# Patient Record
Sex: Male | Born: 1960 | Race: White | Hispanic: No | Marital: Married | Smoking: Never smoker
Health system: Southern US, Community
[De-identification: ages and names within clinical notes are randomized; demographics above are authoritative.]

## PROBLEM LIST (undated history)

## (undated) HISTORY — PX: HERNIA REPAIR: SHX51

## (undated) HISTORY — PX: FINGER AMPUTATION: SHX636

---

## 2009-02-04 ENCOUNTER — Emergency Department: Payer: Self-pay | Admitting: Unknown Physician Specialty

## 2009-04-12 ENCOUNTER — Emergency Department: Payer: Self-pay | Admitting: Internal Medicine

## 2012-05-08 ENCOUNTER — Emergency Department: Payer: Self-pay | Admitting: Emergency Medicine

## 2014-09-11 ENCOUNTER — Emergency Department: Payer: Self-pay | Admitting: Emergency Medicine

## 2016-08-03 ENCOUNTER — Encounter: Payer: Self-pay | Admitting: Emergency Medicine

## 2016-08-03 ENCOUNTER — Emergency Department: Payer: No Typology Code available for payment source

## 2016-08-03 ENCOUNTER — Emergency Department
Admission: EM | Admit: 2016-08-03 | Discharge: 2016-08-03 | Disposition: A | Discharge status: Home / Self Care | Payer: No Typology Code available for payment source | Attending: Emergency Medicine | Admitting: Emergency Medicine

## 2016-08-03 DIAGNOSIS — Y92411 Interstate highway as the place of occurrence of the external cause: Secondary | ICD-10-CM | POA: Insufficient documentation

## 2016-08-03 DIAGNOSIS — R51 Headache: Secondary | ICD-10-CM | POA: Diagnosis not present

## 2016-08-03 DIAGNOSIS — Y9389 Activity, other specified: Secondary | ICD-10-CM | POA: Diagnosis not present

## 2016-08-03 DIAGNOSIS — S60221A Contusion of right hand, initial encounter: Secondary | ICD-10-CM | POA: Diagnosis not present

## 2016-08-03 DIAGNOSIS — S3991XA Unspecified injury of abdomen, initial encounter: Secondary | ICD-10-CM | POA: Diagnosis not present

## 2016-08-03 DIAGNOSIS — R109 Unspecified abdominal pain: Secondary | ICD-10-CM

## 2016-08-03 DIAGNOSIS — R0789 Other chest pain: Secondary | ICD-10-CM | POA: Diagnosis not present

## 2016-08-03 DIAGNOSIS — Y999 Unspecified external cause status: Secondary | ICD-10-CM | POA: Insufficient documentation

## 2016-08-03 DIAGNOSIS — S161XXA Strain of muscle, fascia and tendon at neck level, initial encounter: Secondary | ICD-10-CM | POA: Diagnosis not present

## 2016-08-03 DIAGNOSIS — S199XXA Unspecified injury of neck, initial encounter: Secondary | ICD-10-CM | POA: Diagnosis present

## 2016-08-03 DIAGNOSIS — R079 Chest pain, unspecified: Secondary | ICD-10-CM

## 2016-08-03 LAB — CBC WITH DIFFERENTIAL/PLATELET
BASOS PCT: 0 %
Basophils Absolute: 0.1 10*3/uL (ref 0–0.1)
Eosinophils Absolute: 0.1 10*3/uL (ref 0–0.7)
Eosinophils Relative: 0 %
HEMATOCRIT: 45.9 % (ref 40.0–52.0)
Hemoglobin: 16.3 g/dL (ref 13.0–18.0)
LYMPHS PCT: 7 %
Lymphs Abs: 1 10*3/uL (ref 1.0–3.6)
MCH: 33.4 pg (ref 26.0–34.0)
MCHC: 35.5 g/dL (ref 32.0–36.0)
MCV: 94.1 fL (ref 80.0–100.0)
MONO ABS: 0.8 10*3/uL (ref 0.2–1.0)
MONOS PCT: 6 %
NEUTROS ABS: 12.9 10*3/uL — AB (ref 1.4–6.5)
Neutrophils Relative %: 87 %
Platelets: 196 10*3/uL (ref 150–440)
RBC: 4.88 MIL/uL (ref 4.40–5.90)
RDW: 12.8 % (ref 11.5–14.5)
WBC: 14.9 10*3/uL — ABNORMAL HIGH (ref 3.8–10.6)

## 2016-08-03 LAB — COMPREHENSIVE METABOLIC PANEL
ALBUMIN: 4.5 g/dL (ref 3.5–5.0)
ALK PHOS: 53 U/L (ref 38–126)
ALT: 32 U/L (ref 17–63)
AST: 50 U/L — AB (ref 15–41)
Anion gap: 11 (ref 5–15)
BILIRUBIN TOTAL: 0.7 mg/dL (ref 0.3–1.2)
BUN: 15 mg/dL (ref 6–20)
CO2: 22 mmol/L (ref 22–32)
Calcium: 8.8 mg/dL — ABNORMAL LOW (ref 8.9–10.3)
Chloride: 107 mmol/L (ref 101–111)
Creatinine, Ser: 1.06 mg/dL (ref 0.61–1.24)
GFR calc Af Amer: >60 mL/min (ref >60)
GFR calc non Af Amer: >60 mL/min (ref >60)
GLUCOSE: 102 mg/dL — AB (ref 65–99)
POTASSIUM: 4.1 mmol/L (ref 3.5–5.1)
Sodium: 140 mmol/L (ref 135–145)
TOTAL PROTEIN: 7.4 g/dL (ref 6.5–8.1)

## 2016-08-03 LAB — LIPASE, BLOOD: Lipase: 53 U/L — ABNORMAL HIGH (ref 11–51)

## 2016-08-03 MED ORDER — SODIUM CHLORIDE 0.9 % IV BOLUS (SEPSIS)
1000.0000 mL | Freq: Once | INTRAVENOUS | Status: AC
  Administered 2016-08-03: 1000 mL via INTRAVENOUS

## 2016-08-03 MED ORDER — OXYCODONE-ACETAMINOPHEN 5-325 MG PO TABS: 1.0000 | ORAL_TABLET | Freq: Four times a day (QID) | ORAL | 0 refills | Status: AC | PRN

## 2016-08-03 MED ORDER — HYDROMORPHONE HCL 1 MG/ML IJ SOLN
1.0000 mg | Freq: Once | INTRAMUSCULAR | Status: AC
  Administered 2016-08-03: 1 mg via INTRAVENOUS

## 2016-08-03 MED ORDER — NAPROXEN 500 MG PO TABS: 500.0000 mg | ORAL_TABLET | Freq: Two times a day (BID) | ORAL | 0 refills | Status: DC

## 2016-08-03 MED ORDER — HYDROMORPHONE HCL 1 MG/ML IJ SOLN
INTRAMUSCULAR | Status: AC
  Administered 2016-08-03: 1 mg via INTRAVENOUS
  Filled 2016-08-03: qty 1

## 2016-08-03 MED ORDER — IOPAMIDOL (ISOVUE-300) INJECTION 61%
100.0000 mL | Freq: Once | INTRAVENOUS | Status: AC | PRN
  Administered 2016-08-03: 100 mL via INTRAVENOUS
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
## 2016-08-03 NOTE — ED Notes (Signed)
Pt. Back from CT.
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
## 2016-08-03 NOTE — ED Notes (Signed)
Pt. Going home with wife. 

## 2020-09-08 ENCOUNTER — Emergency Department: Payer: No Typology Code available for payment source

## 2020-09-08 ENCOUNTER — Emergency Department
Admission: EM | Admit: 2020-09-08 | Discharge: 2020-09-08 | Disposition: A | Discharge status: Home / Self Care | Payer: No Typology Code available for payment source | Attending: Emergency Medicine | Admitting: Emergency Medicine

## 2020-09-08 ENCOUNTER — Other Ambulatory Visit: Payer: Self-pay

## 2020-09-08 DIAGNOSIS — M25562 Pain in left knee: Secondary | ICD-10-CM

## 2020-09-08 DIAGNOSIS — Y92009 Unspecified place in unspecified non-institutional (private) residence as the place of occurrence of the external cause: Secondary | ICD-10-CM | POA: Diagnosis not present

## 2020-09-08 DIAGNOSIS — W01198A Fall on same level from slipping, tripping and stumbling with subsequent striking against other object, initial encounter: Secondary | ICD-10-CM | POA: Insufficient documentation

## 2020-09-08 MED ORDER — NAPROXEN 500 MG PO TABS: 500.0000 mg | ORAL_TABLET | Freq: Two times a day (BID) | ORAL | 0 refills | Status: AC

## 2020-09-08 NOTE — ED Notes (Signed)
WC Profile for [employer] only states UDS obtain upon request. Pt states he does not have a number to his direct supervisor and office closed at 12:00pm today. UDS ineligibility form filled out on this pt./

## 2020-09-08 NOTE — ED Triage Notes (Signed)
Pt present POV from home. Pt states he was at work last night, he slipped in the break room and hit his L knee on the floor. Denies LOC. Pt is A&Ox4 and NAD. Pt c/o L knee pain. Pt ambulatory to triage.

## 2020-09-08 NOTE — ED Provider Notes (Signed)
[facility] Emergency Department Provider Note   ____________________________________________   First MD Initiated Contact with Patient 09/08/20 1635     (approximate)  I have reviewed the triage vital signs and the nursing notes.   HISTORY  Chief Complaint Knee Pain    HPI Roberto Peterson is a 59 y.o. male patient complain of left knee pain secondary to a slip and fall which occurred last night at work.  Patient said he hit his knee on the floor.  Patient is able to bear weight and there is no loss of sensation.  Patient rates pain as a 5/10.  Patient described the pain is "sore".  No palliative measure for complaint.         No past medical history on file.  There are no problems to display for this patient.   Past Surgical History:  Procedure Laterality Date  . FINGER AMPUTATION    . HERNIA REPAIR      Prior to Admission medications   Medication Sig Start Date End Date Taking? Authorizing Provider  naproxen (NAPROSYN) 500 MG tablet Take 1 tablet (500 mg total) by mouth 2 (two) times daily with a meal. 09/08/20   Joni Reining, PA-C  oxyCODONE-acetaminophen (ROXICET) 5-325 MG tablet Take 1 tablet by mouth every 6 (six) hours as needed for severe pain. 08/03/16   Sharman Cheek, MD    Allergies Patient has no known allergies.  No family history on file.  Social History Social History   Tobacco Use  . Smoking status: Never Smoker  . Smokeless tobacco: Never Used  Substance Use Topics  . Alcohol use: Not on file  . Drug use: Not on file    Review of Systems Constitutional: No fever/chills Eyes: No visual changes. ENT: No sore throat. Cardiovascular: Denies chest pain. Respiratory: Denies shortness of breath. Gastrointestinal: No abdominal pain.  No nausea, no vomiting.  No diarrhea.  No constipation. Genitourinary: Negative for dysuria. Musculoskeletal: Left knee pain. Skin: Negative for rash. Neurological: Negative for  headaches, focal weakness or numbness.   ____________________________________________   PHYSICAL EXAM:  VITAL SIGNS: ED Triage Vitals  Enc Vitals Group     BP 09/08/20 1638 (!) 141/81     Pulse Rate 09/08/20 1638 (!) 46     Resp 09/08/20 1638 20     Temp 09/08/20 1645 98.3 F (36.8 C)     Temp Source 09/08/20 1645 Oral     SpO2 09/08/20 1638 99 %     Weight 09/08/20 1638 180 lb (81.6 kg)     Height 09/08/20 1638 5\' 10"  (1.778 m)     Head Circumference --      Peak Flow --      Pain Score 09/08/20 1638 5     Pain Loc --      Pain Edu? --      Excl. in GC? --     Constitutional: Alert and oriented. Well appearing and in no acute distress. Cardiovascular: Normal rate, regular rhythm. Grossly normal heart sounds.  Good peripheral circulation. Respiratory: Normal respiratory effort.  No retractions. Lungs CTAB. Musculoskeletal: No obvious deformity to the left knee.  Patient is moderate guarding with palpation of the tibia plateau. Neurologic:  Normal speech and language. No gross focal neurologic deficits are appreciated. No gait instability. Skin:  Skin is warm, dry and intact. No rash noted.  No abrasion or ecchymosis. Psychiatric: Mood and affect are normal. Speech and behavior are normal.  ____________________________________________  LABS (all labs ordered are listed, but only abnormal results are displayed)  Labs Reviewed - No data to display ____________________________________________  EKG   ____________________________________________  RADIOLOGY I, Joni Reining, personally viewed and evaluated these images (plain radiographs) as part of my medical decision making, as well as reviewing the written report by the radiologist.  ED MD interpretation: No acute findings on x-ray of the left knee. Official radiology report(s): DG Knee Complete 4 Views Left  Result Date: 09/08/2020 CLINICAL DATA:  Pain following fall EXAM: LEFT KNEE - COMPLETE 4+ VIEW  COMPARISON:  None. FINDINGS: Frontal, lateral, and bilateral oblique views were obtained. There is no appreciable fracture or dislocation. Joint spaces appear unremarkable. No erosive change. There are foci of atherosclerotic vascular calcification at several sites. IMPRESSION: No fracture, dislocation, or joint effusion. No evident arthropathy. Foci of arterial vascular calcification noted. Electronically Signed   By: Bretta Bang III M.D.   On: 09/08/2020 17:30    ____________________________________________   PROCEDURES  Procedure(s) performed (including Critical Care):  Procedures   ____________________________________________   INITIAL IMPRESSION / ASSESSMENT AND PLAN / ED COURSE  As part of my medical decision making, I reviewed the following data within the electronic MEDICAL RECORD NUMBER     Patient presented with left knee pain secondary to a slip and fall which occurred last night at work.  Discussed no acute findings on x-ray of the left knee.  Patient complaint and physical exam consistent with knee contusion secondary to fall.  Patient placed in a knee support and given discharge care instruction.  Patient can have a prescription for naproxen take as needed patient advised if no improvement or worsening complaint to follow-up with orthopedic listed in the discharge care instructions.         ____________________________________________   FINAL CLINICAL IMPRESSION(S) / ED DIAGNOSES  Final diagnoses:  Acute pain of left knee     ED Discharge Orders         Ordered    naproxen (NAPROSYN) 500 MG tablet  2 times daily with meals        09/08/20 1744          *Please note:  Roberto Peterson was evaluated in Emergency Department on 09/08/2020 for the symptoms described in the history of present illness. He was evaluated in the context of the global COVID-19 pandemic, which necessitated consideration that the patient might be at risk for infection with the SARS-CoV-2  virus that causes COVID-19. Institutional protocols and algorithms that pertain to the evaluation of patients at risk for COVID-19 are in a state of rapid change based on information released by regulatory bodies including the CDC and federal and state organizations. These policies and algorithms were followed during the patient's care in the ED.  Some ED evaluations and interventions may be delayed as a result of limited staffing during and the pandemic.*   Note:  This document was prepared using Dragon voice recognition software and may include unintentional dictation errors.    Joni Reining, PA-C 09/08/20 1749    Dionne Bucy, MD 09/08/20 2037

## 2020-09-08 NOTE — Discharge Instructions (Signed)
Wear knee support and follow discharge care instruction.  Take medication as needed.

## 2021-08-06 IMAGING — CR DG KNEE COMPLETE 4+V*L*
4 series · 4 of 4 positions shown · non-contrast
Comparison: None.

CLINICAL DATA: Pain following fall

EXAM:
LEFT KNEE - COMPLETE 4+ VIEW

[knee ap]
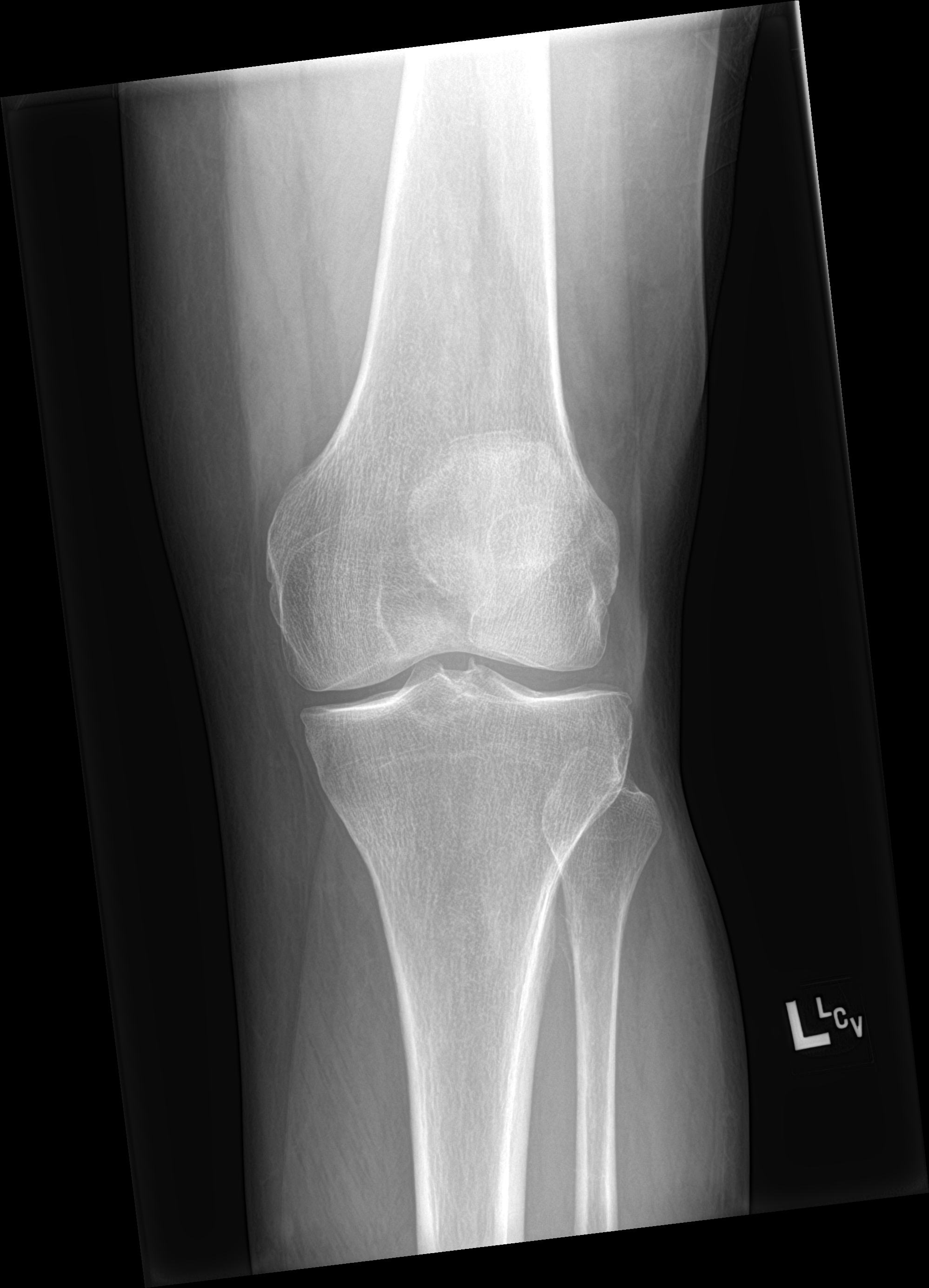

[knee obl (1 of 2)]
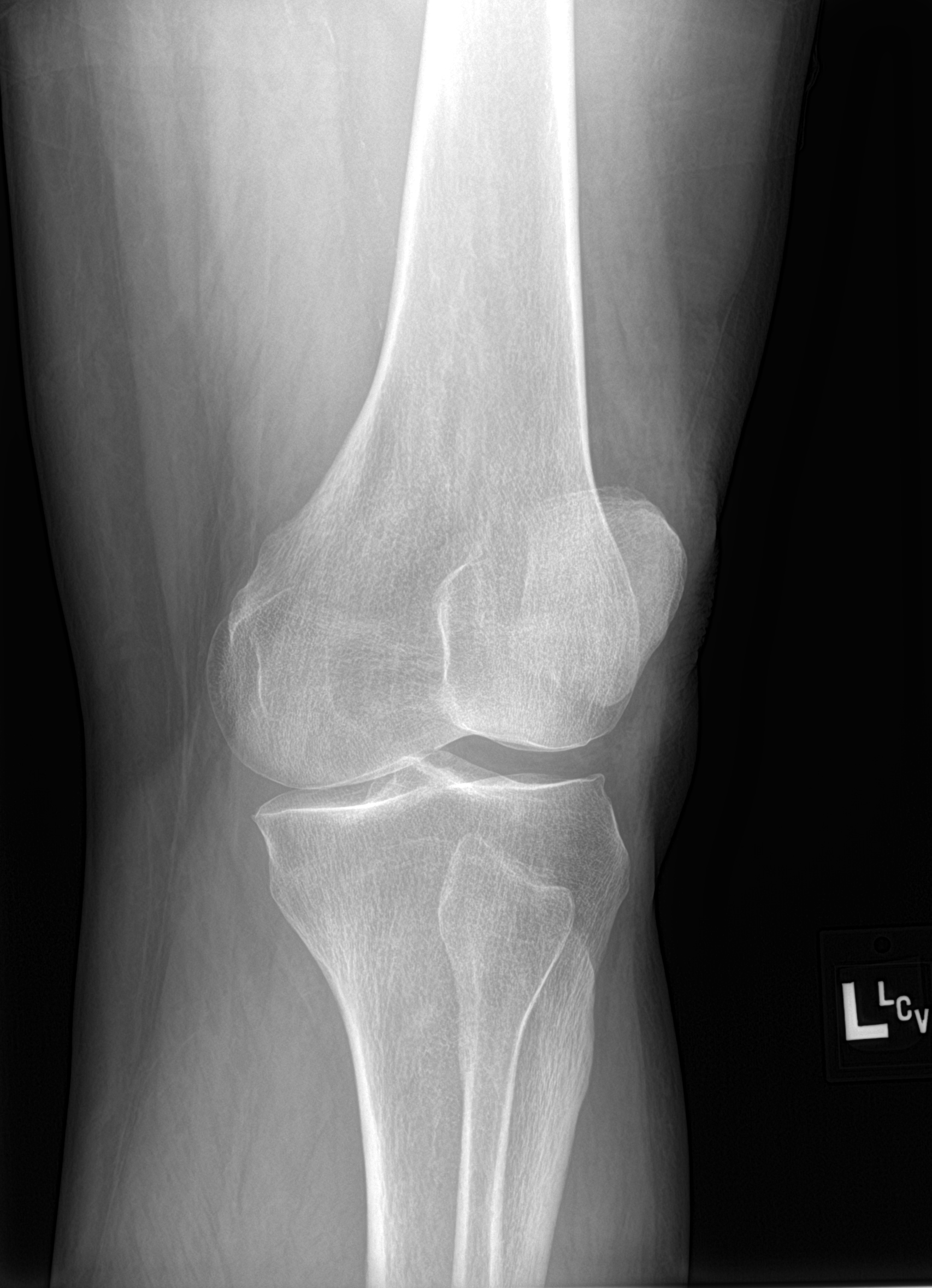

[knee obl (2 of 2)]
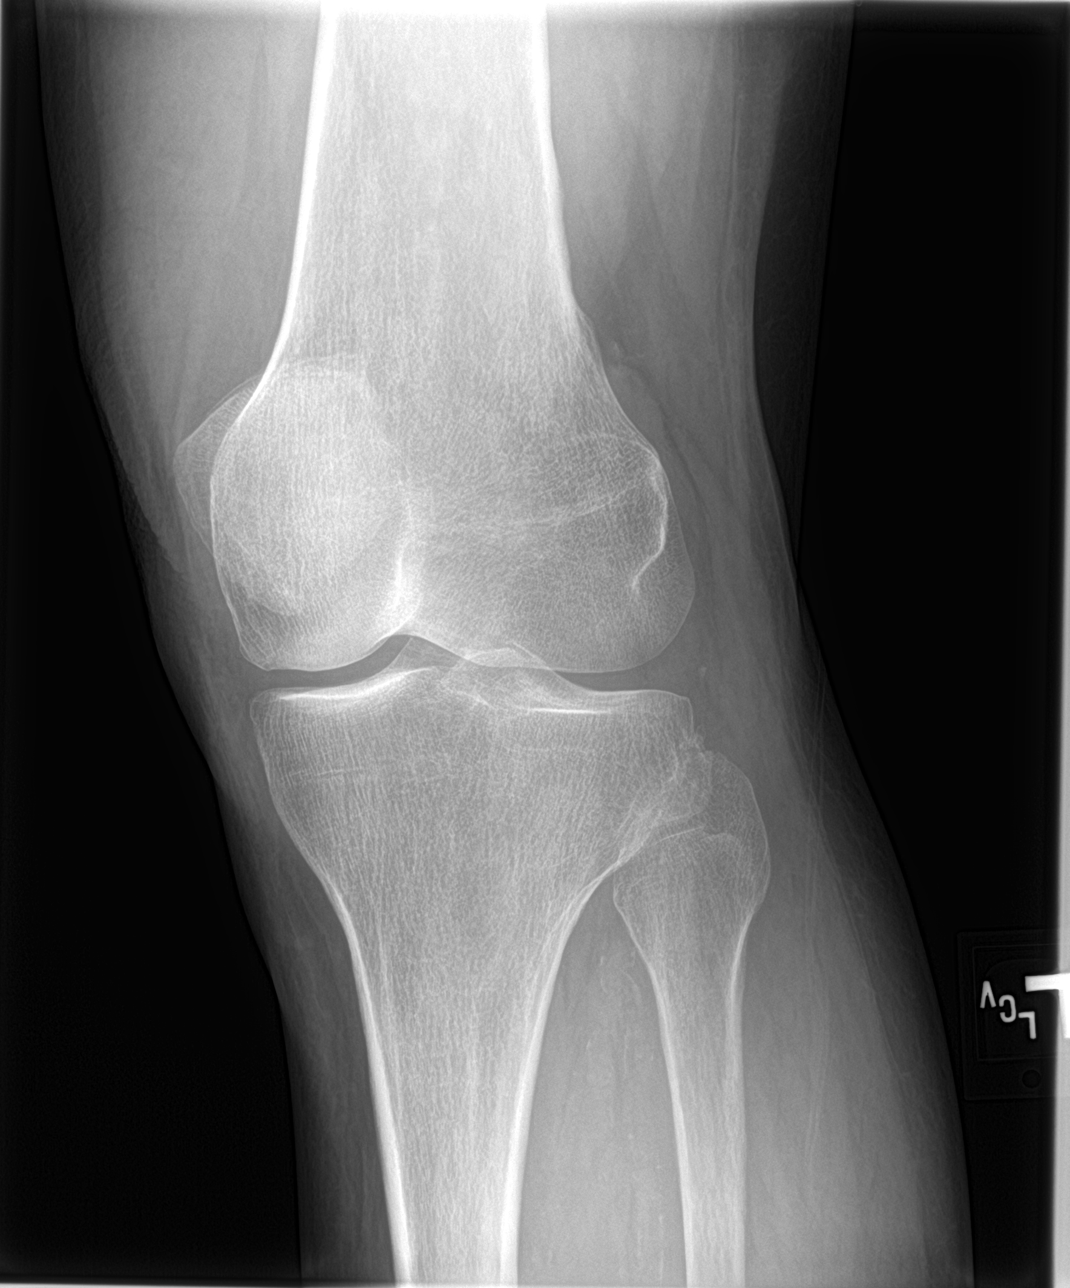

[knee lat]
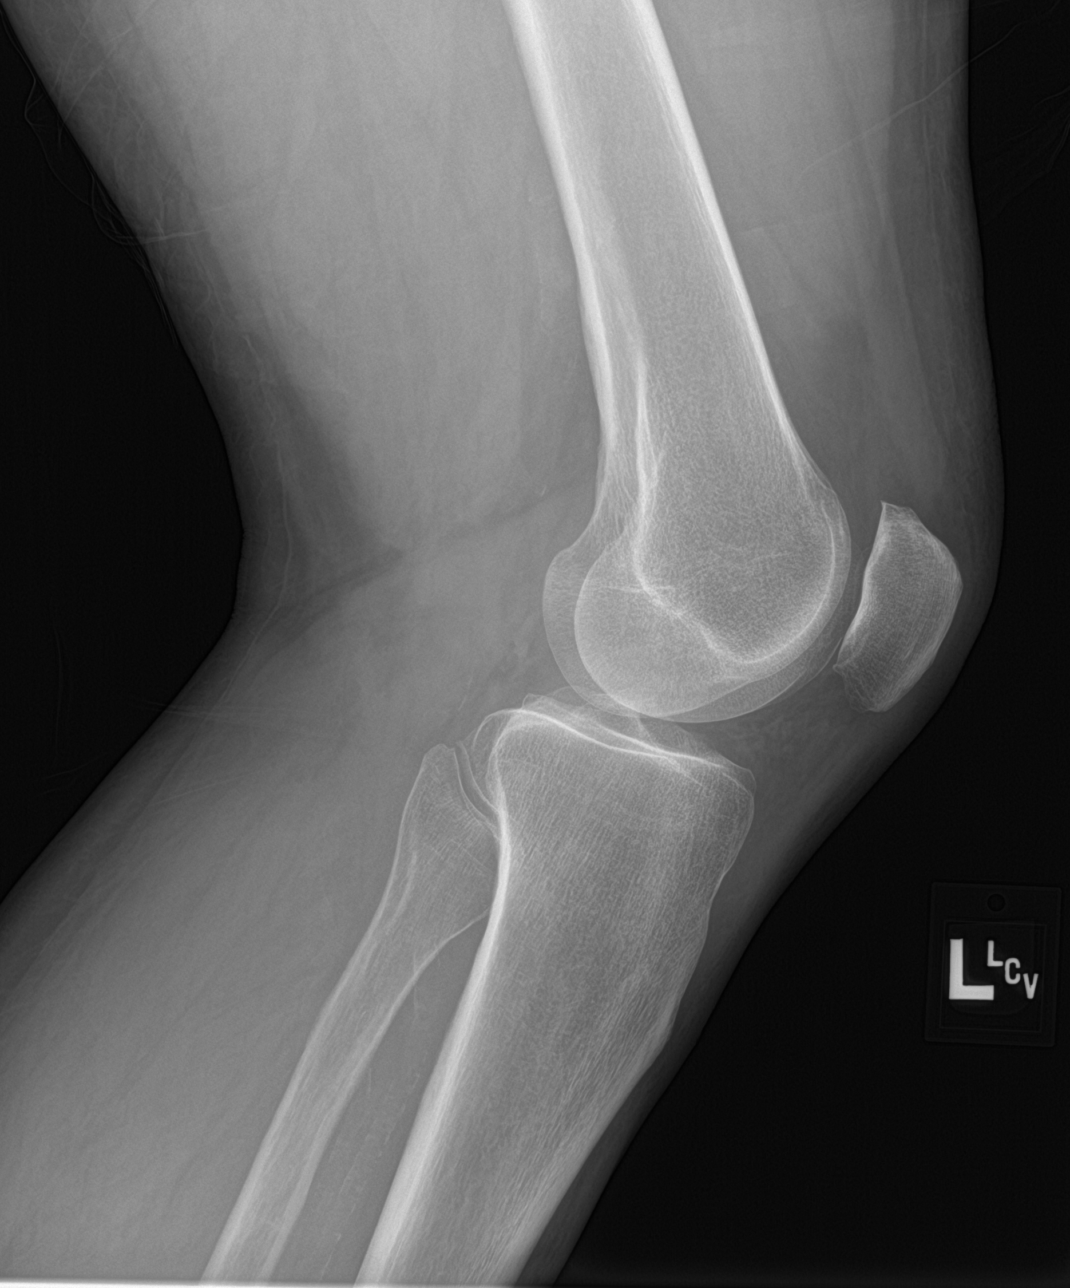

[4 of 4 positions shown; findings below may reference images not displayed]

FINDINGS: Frontal, lateral, and bilateral oblique views were obtained. There
is no appreciable fracture or dislocation. Joint spaces appear
unremarkable. No erosive change. There are foci of atherosclerotic
vascular calcification at several sites.
IMPRESSION: No fracture, dislocation, or joint effusion. No evident arthropathy.
Foci of arterial vascular calcification noted.

## 2022-04-18 ENCOUNTER — Other Ambulatory Visit: Payer: Self-pay | Admitting: Orthopedic Surgery

## 2022-04-18 DIAGNOSIS — M25562 Pain in left knee: Secondary | ICD-10-CM
